# Patient Record
Sex: Male | Born: 1990 | Race: White | Hispanic: No | Marital: Single | Smoking: Never smoker
Health system: Southern US, Community
[De-identification: ages and names within clinical notes are randomized; demographics above are authoritative.]

---

## 2017-04-24 ENCOUNTER — Emergency Department (HOSPITAL_COMMUNITY): Payer: Managed Care, Other (non HMO)

## 2017-04-24 ENCOUNTER — Encounter (HOSPITAL_COMMUNITY): Payer: Self-pay

## 2017-04-24 ENCOUNTER — Emergency Department (HOSPITAL_COMMUNITY)
Admission: EM | Admit: 2017-04-24 | Discharge: 2017-04-25 | Disposition: A | Discharge status: Home / Self Care | Payer: Managed Care, Other (non HMO) | Attending: Emergency Medicine | Admitting: Emergency Medicine

## 2017-04-24 DIAGNOSIS — X509XXA Other and unspecified overexertion or strenuous movements or postures, initial encounter: Secondary | ICD-10-CM | POA: Insufficient documentation

## 2017-04-24 DIAGNOSIS — Y929 Unspecified place or not applicable: Secondary | ICD-10-CM | POA: Diagnosis not present

## 2017-04-24 DIAGNOSIS — S43102A Unspecified dislocation of left acromioclavicular joint, initial encounter: Secondary | ICD-10-CM | POA: Insufficient documentation

## 2017-04-24 DIAGNOSIS — Z79899 Other long term (current) drug therapy: Secondary | ICD-10-CM | POA: Diagnosis not present

## 2017-04-24 DIAGNOSIS — Y999 Unspecified external cause status: Secondary | ICD-10-CM | POA: Diagnosis not present

## 2017-04-24 DIAGNOSIS — S4992XA Unspecified injury of left shoulder and upper arm, initial encounter: Secondary | ICD-10-CM | POA: Diagnosis present

## 2017-04-24 DIAGNOSIS — Y9367 Activity, basketball: Secondary | ICD-10-CM | POA: Insufficient documentation

## 2017-04-24 MED ORDER — ONDANSETRON 4 MG PO TBDP
4.0000 mg | ORAL_TABLET | Freq: Once | ORAL | Status: AC
  Administered 2017-04-24: 4 mg via ORAL
  Filled 2017-04-24: qty 1

## 2017-04-24 MED ORDER — ONDANSETRON HCL 4 MG PO TABS: 4.0000 mg | ORAL_TABLET | Freq: Three times a day (TID) | ORAL | 0 refills | Status: AC | PRN

## 2017-04-24 MED ORDER — DICLOFENAC SODIUM 50 MG PO TBEC: 50.0000 mg | DELAYED_RELEASE_TABLET | Freq: Two times a day (BID) | ORAL | 0 refills | Status: AC

## 2017-04-24 MED ORDER — OXYCODONE-ACETAMINOPHEN 5-325 MG PO TABS: 1.0000 | ORAL_TABLET | Freq: Four times a day (QID) | ORAL | 0 refills | Status: AC | PRN

## 2017-04-24 MED ORDER — OXYCODONE-ACETAMINOPHEN 5-325 MG PO TABS
1.0000 | ORAL_TABLET | Freq: Once | ORAL | Status: AC
  Administered 2017-04-24: 1 via ORAL
  Filled 2017-04-24: qty 1

## 2017-04-24 NOTE — ED Notes (Signed)
Pt stable, understands discharge instructions, and reasons for return.   

## 2017-04-24 NOTE — Progress Notes (Signed)
Orthopedic Tech Progress Note Patient Details:  Samuel JourdainZachary Whitaker [birth year] [id number]  Ortho Devices Type of Ortho Device: Sling immobilizer Ortho Device/Splint Location: lue Ortho Device/Splint Interventions: Ordered, Application, Adjustment   Trinna PostMartinez, Crecencio Kwiatek J 04/24/2017, 11:42 PM

## 2017-04-24 NOTE — ED Triage Notes (Signed)
Pt reports left shoulder pain secondary to injuring it while playing basketball and heard something "pop." No visible deformity and pt is able to move his shoulder but states it is very painful. Denies head injury, no LOC.

## 2017-04-24 NOTE — ED Notes (Signed)
Pt reports contact injury to left shoulder related to playing basketball. Pt has not able to extend arm. See providers assessment.

## 2017-04-24 NOTE — ED Provider Notes (Signed)
[facility]-EMERGENCY DEPT Provider Note   CSN: [id number] Arrival date & time: 04/24/17  2152  By signing my name below, I, Linna Darner, attest that this documentation has been prepared under the direction and in the presence of [facility] M. Damian Leavell, NP. Electronically Signed: Linna Darner, Scribe. 04/24/2017. 11:17 PM.  History   Chief Complaint Chief Complaint  Patient presents with  . Shoulder Injury   The history is provided by the patient. No language interpreter was used.  Shoulder Injury  This is a new problem. The current episode started 3 to 5 hours ago. The problem occurs constantly. The problem has not changed since onset.Pertinent negatives include no chest pain, no abdominal pain, no headaches and no shortness of breath. Exacerbated by: raising left arm. Nothing relieves the symptoms. He has tried a cold compress (& ibuprofen) for the symptoms. The treatment provided no relief.    HPI Comments: Samuel Whitaker is a 26 y.o. male who presents to the Emergency Department for evaluation of a left shoulder injury sustained this evening. He was playing basketball and heard a popping sensation from his left shoulder while attempting a lay-up. He states he immediately experienced pain in his left shoulder. Patient reports severe pain with raising his left arm and endorses a decreased ROM secondary to pain. He took ibuprofen and applied ice to his left shoulder prior to arrival without significant improvement of his pain. He denies back pain, neck pain, bruising, open wounds, nausea, vomiting, or any other associated symptoms.  History reviewed. No pertinent past medical history.  There are no active problems to display for this patient.   History reviewed. No pertinent surgical history.     Home Medications    Prior to Admission medications   Medication Sig Start Date End Date Taking? Authorizing Provider  diclofenac (VOLTAREN) 50 MG EC tablet Take 1 tablet (50 mg total) by mouth 2 (two)  times daily. 04/24/17   Janne Napoleon, NP  ondansetron (ZOFRAN) 4 MG tablet Take 1 tablet (4 mg total) by mouth every 8 (eight) hours as needed for nausea or vomiting. 04/24/17   Janne Napoleon, NP  oxyCODONE-acetaminophen (PERCOCET/ROXICET) 5-325 MG tablet Take 1 tablet by mouth every 6 (six) hours as needed for severe pain. 04/24/17   Janne Napoleon, NP    Family History No family history on file.  Social History Social History  Substance Use Topics  . Smoking status: Never Smoker  . Smokeless tobacco: Never Used  . Alcohol use Yes     Allergies   Patient has no allergy information on record.   Review of Systems Review of Systems  Respiratory: Negative for shortness of breath.   Cardiovascular: Negative for chest pain.  Gastrointestinal: Negative for abdominal pain, nausea and vomiting.  Musculoskeletal: Positive for myalgias. Negative for back pain and neck pain.  Skin: Negative for color change and wound.  Neurological: Negative for headaches.  All other systems reviewed and are negative.  Physical Exam Updated Vital Signs BP (!) 135/91 (BP Location: Right Arm)   Pulse 76   Temp 98.1 F (36.7 C) (Oral)   Resp 18   SpO2 96%   Physical Exam  Constitutional: He is oriented to person, place, and time. He appears well-developed and well-nourished. No distress.  Patient appears very uncomfortable.  HENT:  Head: Normocephalic and atraumatic.  Eyes: Conjunctivae and EOM are normal.  Neck: Neck supple.  Cardiovascular: Normal rate.   Pulses:      Radial pulses are 2+  on the right side, and 2+ on the left side.  Pulmonary/Chest: Effort normal. No respiratory distress.  Musculoskeletal: He exhibits tenderness.  Tenderness over the left AC joint. No obvious deformity.  Neurological: He is alert and oriented to person, place, and time.  Skin: There is pallor.  Psychiatric: He has a normal mood and affect. His behavior is normal.  Nursing note and vitals reviewed.  ED  Treatments / Results  Labs (all labs ordered are listed, but only abnormal results are displayed) Labs Reviewed - No data to display   Radiology Dg Shoulder Left  Result Date: 04/24/2017 CLINICAL DATA:  Acute onset of left shoulder pain at the acromioclavicular joint, after injury while playing basketball. Initial encounter. EXAM: LEFT SHOULDER - 2+ VIEW COMPARISON:  None. FINDINGS: There is no evidence of fracture or dislocation. The left humeral head is seated within the glenoid fossa. There is elevation of the distal left clavicle, compatible with a [place] type 2 acromioclavicular joint injury, given the patient's symptoms. No significant soft tissue abnormalities are seen. The visualized portions of the left lung are clear. IMPRESSION: 1. No evidence of fracture or dislocation. 2. [place] type 2 left acromioclavicular joint injury. Electronically Signed   By: Roanna RaiderJeffery  Chang M.D.   On: 04/24/2017 22:26    Procedures Procedures (including critical care time)  DIAGNOSTIC STUDIES: Oxygen Saturation is 98% on RA, normal by my interpretation.    COORDINATION OF CARE: 11:17 PM Discussed treatment plan with pt at bedside and pt agreed to plan.  Medications Ordered in ED Medications  ondansetron (ZOFRAN-ODT) disintegrating tablet 4 mg (4 mg Oral Given 04/24/17 2337)  oxyCODONE-acetaminophen (PERCOCET/ROXICET) 5-325 MG per tablet 1 tablet (1 tablet Oral Given 04/24/17 2337)     Initial Impression / Assessment and Plan / ED Course  I have reviewed the triage vital signs and the nursing notes. Patient X-Ray reveals [place] type 2 left acromioclavicular joint injury.  Pt advised to follow up with orthopedics. Shoulder immobilizer applied, ice, rest, pain management and f/u with ortho. Patient will be discharged home & is agreeable with above plan. Returns precautions discussed. Pt appears safe for discharge and remains neurovascularly intact.   Final Clinical Impressions(s) / ED Diagnoses     Final diagnoses:  AC separation, type 2, left, initial encounter    New Prescriptions Discharge Medication List as of 04/24/2017 11:28 PM    START taking these medications   Details  diclofenac (VOLTAREN) 50 MG EC tablet Take 1 tablet (50 mg total) by mouth 2 (two) times daily., Starting Thu 04/24/2017, Print    ondansetron (ZOFRAN) 4 MG tablet Take 1 tablet (4 mg total) by mouth every 8 (eight) hours as needed for nausea or vomiting., Starting Thu 04/24/2017, Print    oxyCODONE-acetaminophen (PERCOCET/ROXICET) 5-325 MG tablet Take 1 tablet by mouth every 6 (six) hours as needed for severe pain., Starting Thu 04/24/2017, Print      I personally performed the services described in this documentation, which was scribed in my presence. The recorded information has been reviewed and is accurate.    Kerrie Buffaloeese, Shamel Galyean [place], [place] 04/26/17 1651    Azalia Bilisampos, Kevin, MD 04/28/17 [phone]

## 2018-08-24 IMAGING — CR DG SHOULDER 2+V*L*
3 series · 3 of 3 positions shown · non-contrast
Comparison: None.

CLINICAL DATA: Acute onset of left shoulder pain at the
acromioclavicular joint, after injury while playing basketball.
Initial encounter.

EXAM:
LEFT SHOULDER - 2+ VIEW

[shoulder grashey]
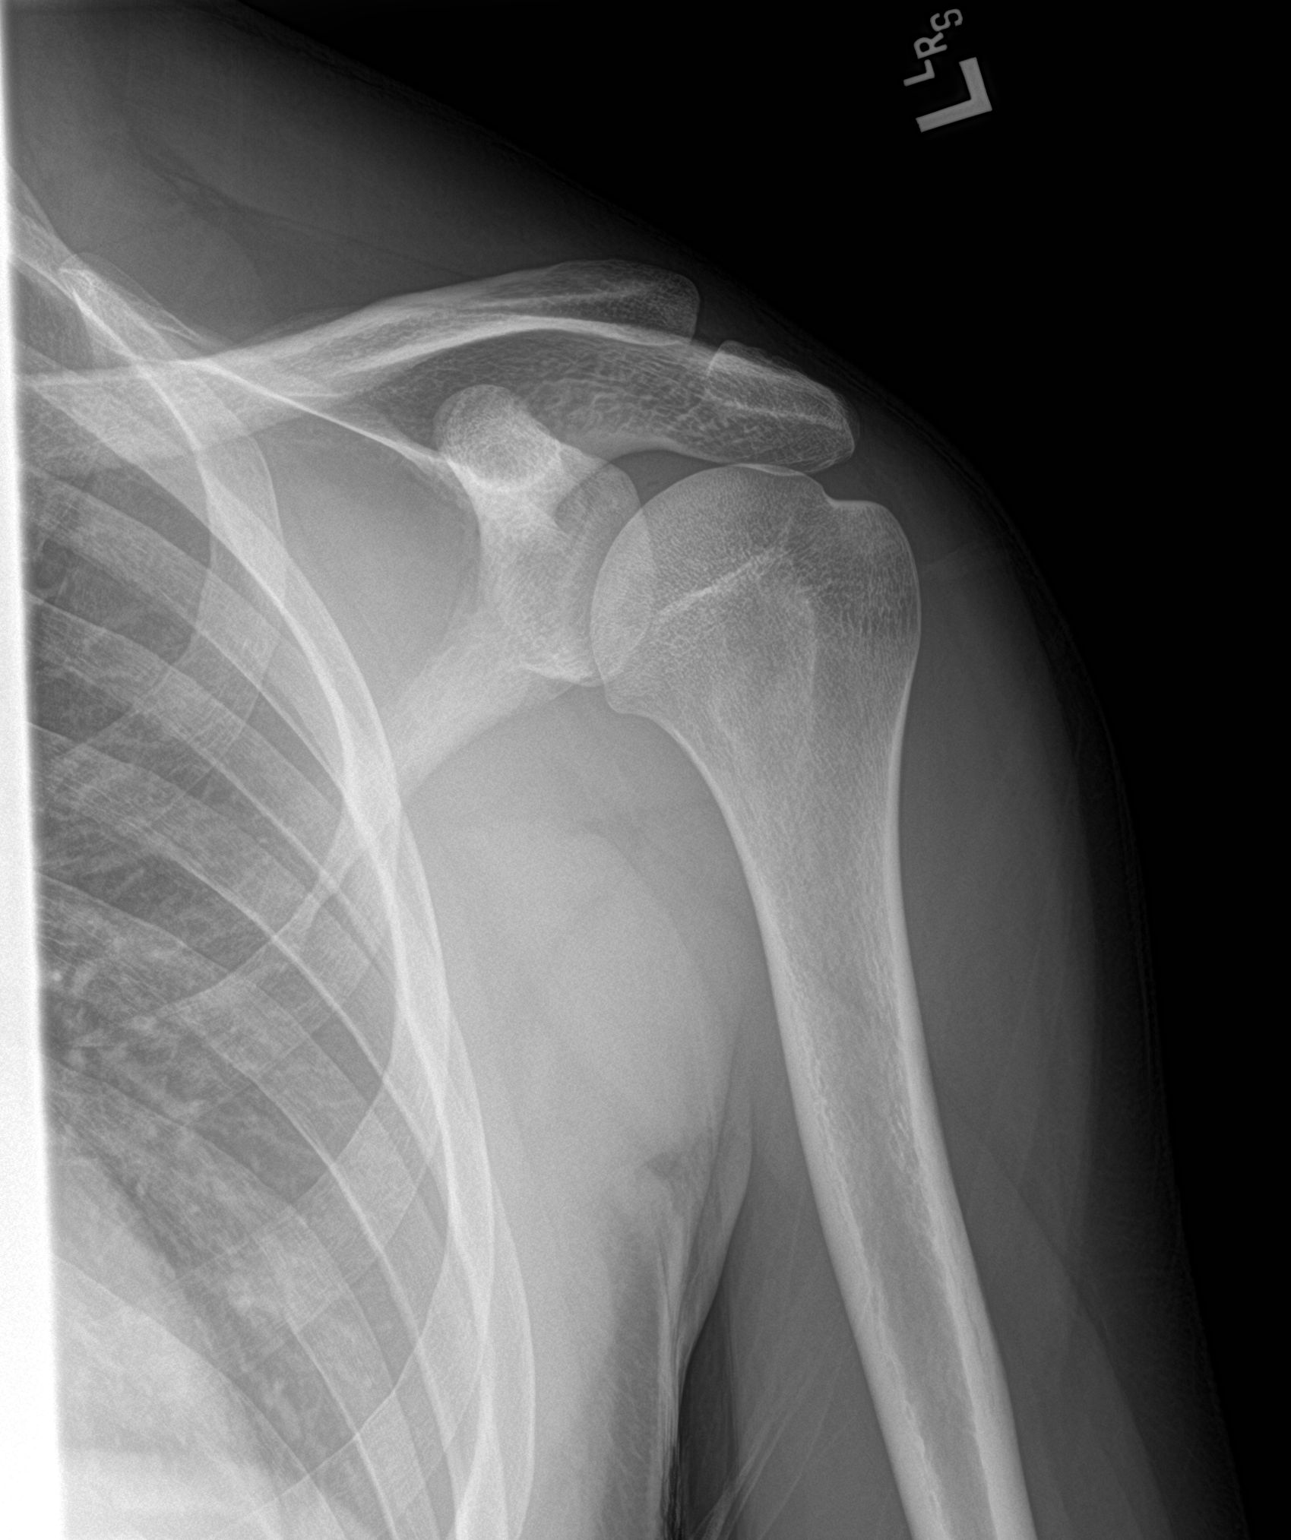

[shoulder y view]
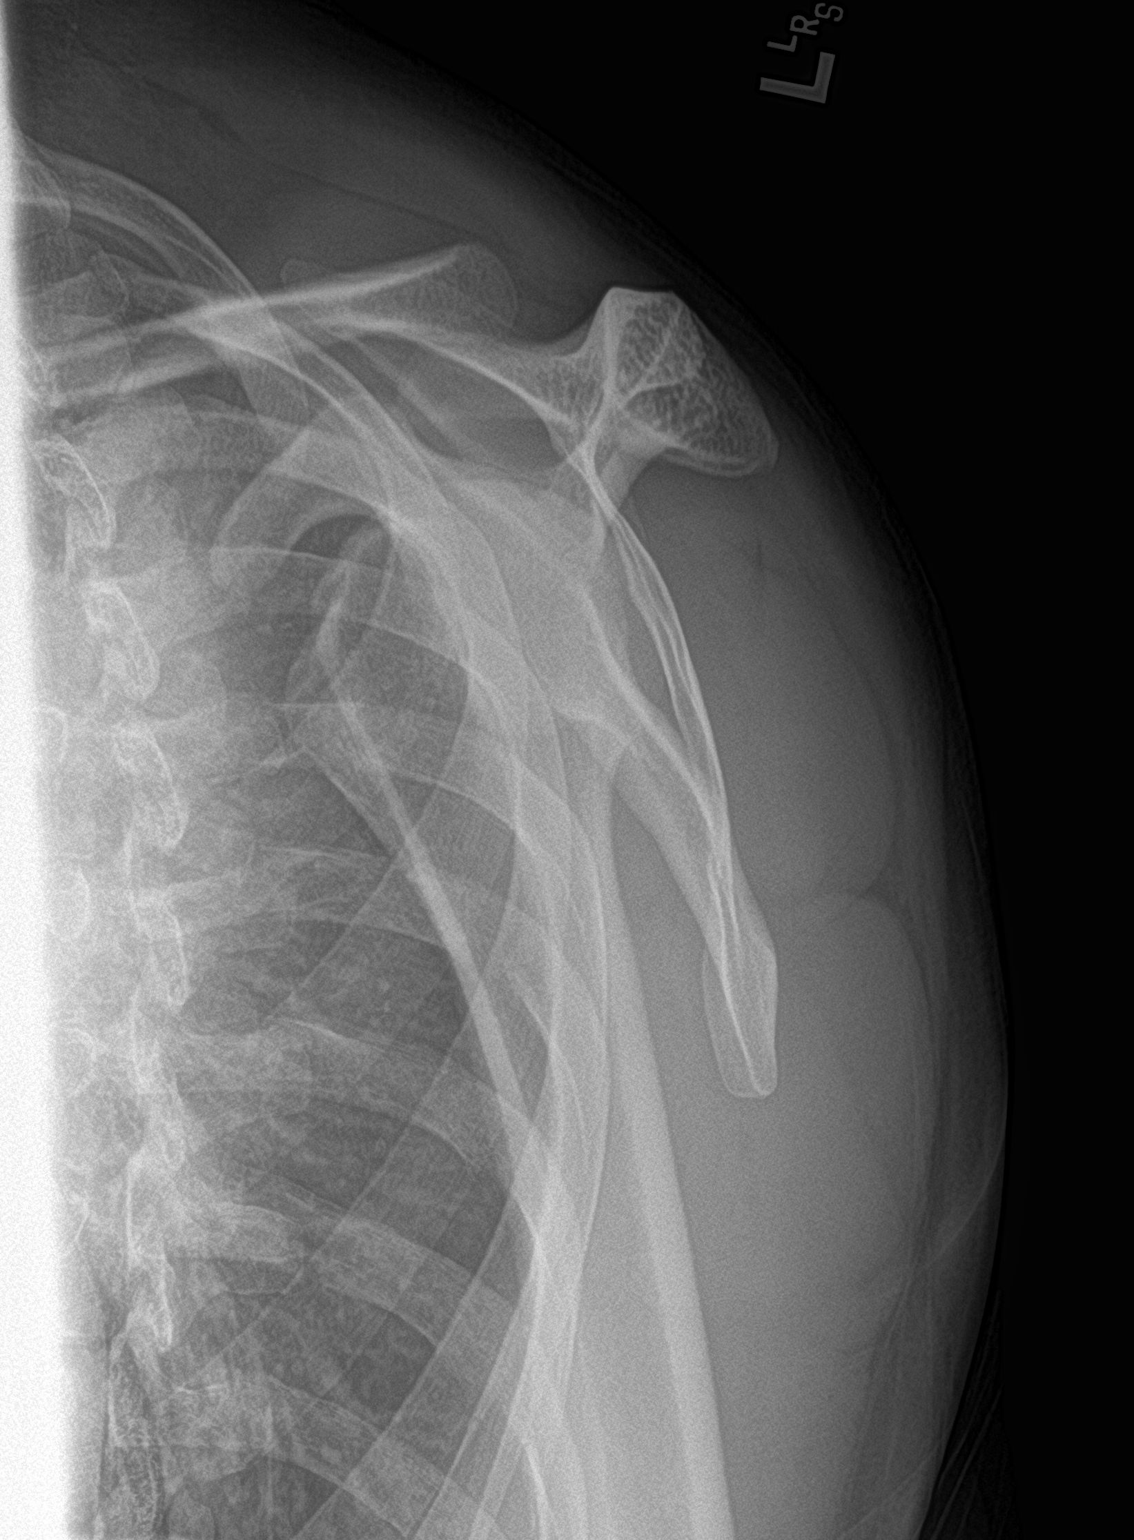

[shoulder axillary]
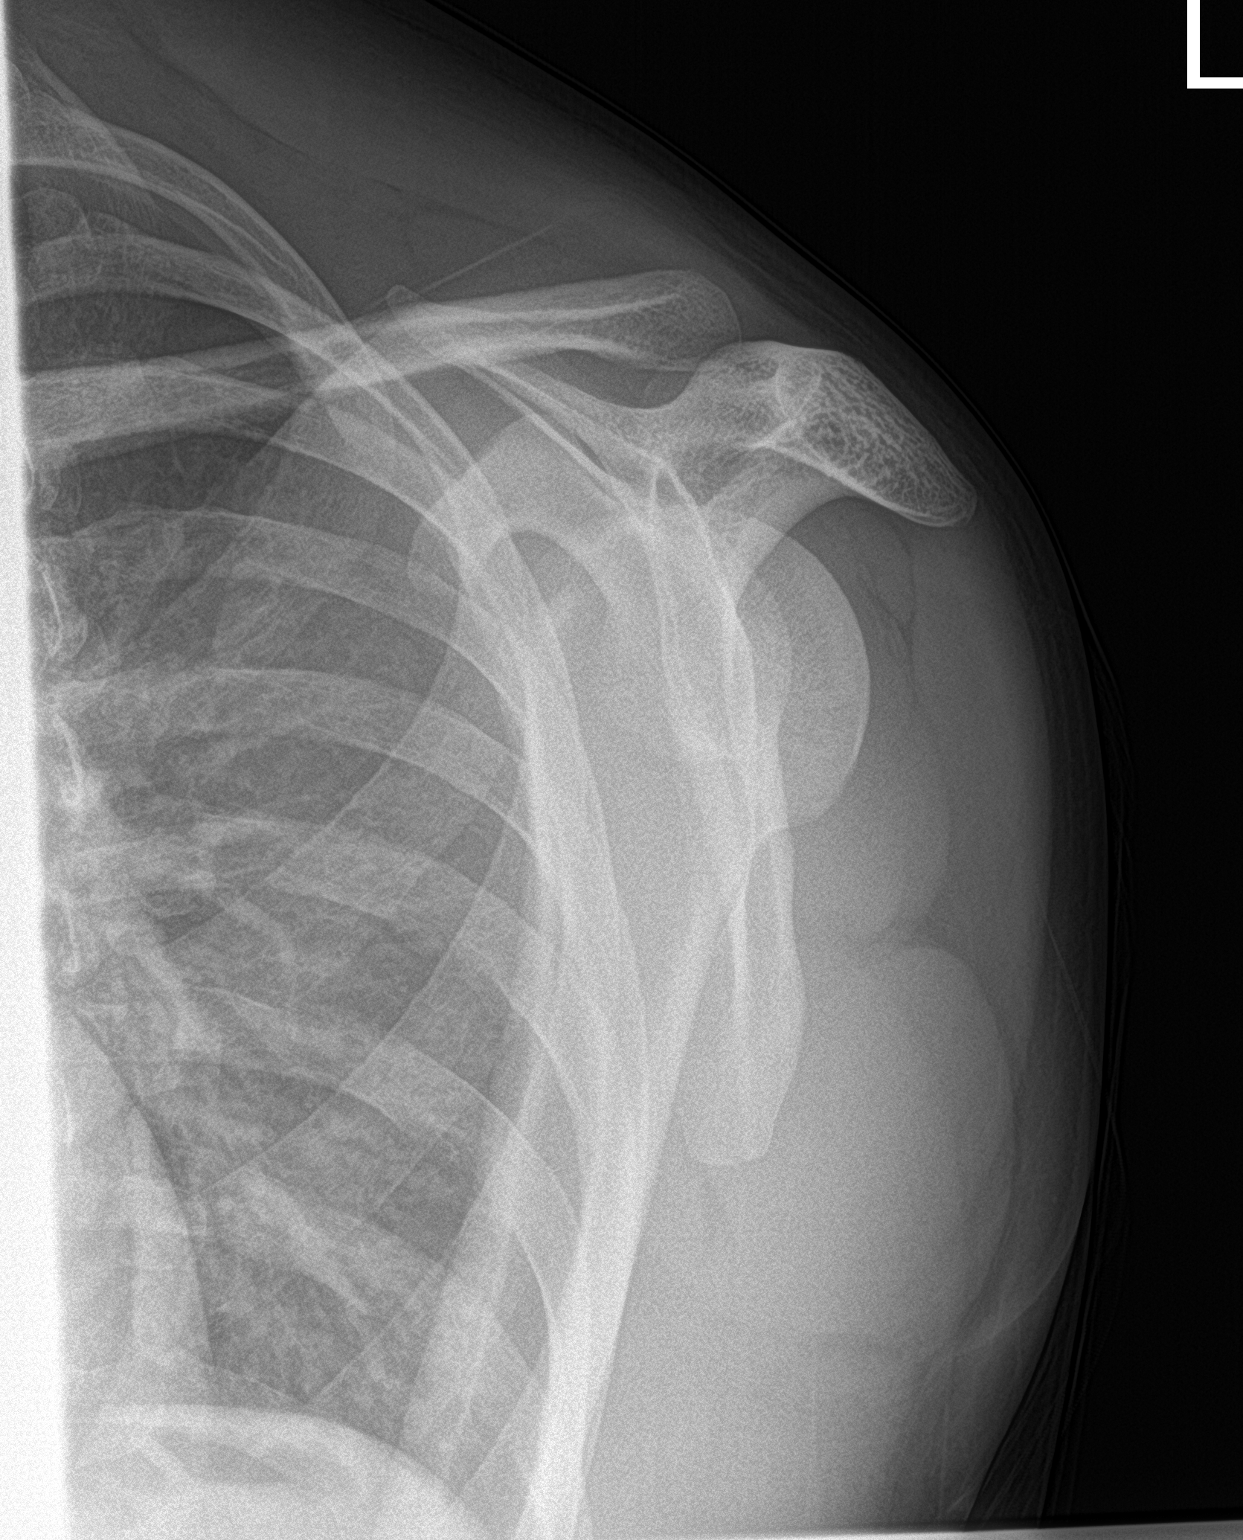

[3 of 3 positions shown; findings below may reference images not displayed]

FINDINGS: There is no evidence of fracture or dislocation. The left humeral
head is seated within the glenoid fossa.

There is elevation of the distal left clavicle, compatible with [employer]
[place] type 2 acromioclavicular joint injury, given the patient's
symptoms. No significant soft tissue abnormalities are seen. The
visualized portions of the left lung are clear.
IMPRESSION: 1. No evidence of fracture or dislocation.
2. [place] type 2 left acromioclavicular joint injury.
